# Patient Record
Sex: Female | Born: 1970 | Hispanic: No | Smoking: Never smoker
Health system: Southern US, Community
[De-identification: ages and names within clinical notes are randomized; demographics above are authoritative.]

---

## 2016-11-17 ENCOUNTER — Emergency Department (HOSPITAL_COMMUNITY): Payer: Medicare Other

## 2016-11-17 ENCOUNTER — Encounter (HOSPITAL_COMMUNITY): Payer: Self-pay | Admitting: *Deleted

## 2016-11-17 ENCOUNTER — Emergency Department (HOSPITAL_COMMUNITY)
Admission: EM | Admit: 2016-11-17 | Discharge: 2016-11-17 | Disposition: A | Discharge status: Home / Self Care | Payer: Medicare Other | Attending: Emergency Medicine | Admitting: Emergency Medicine

## 2016-11-17 DIAGNOSIS — Y999 Unspecified external cause status: Secondary | ICD-10-CM | POA: Diagnosis not present

## 2016-11-17 DIAGNOSIS — Y939 Activity, unspecified: Secondary | ICD-10-CM | POA: Insufficient documentation

## 2016-11-17 DIAGNOSIS — Y92410 Unspecified street and highway as the place of occurrence of the external cause: Secondary | ICD-10-CM | POA: Diagnosis not present

## 2016-11-17 DIAGNOSIS — R51 Headache: Secondary | ICD-10-CM | POA: Insufficient documentation

## 2016-11-17 DIAGNOSIS — R0789 Other chest pain: Secondary | ICD-10-CM | POA: Insufficient documentation

## 2016-11-17 DIAGNOSIS — R079 Chest pain, unspecified: Secondary | ICD-10-CM | POA: Diagnosis present

## 2016-11-17 LAB — I-STAT TROPONIN, ED: TROPONIN I, POC: 0 ng/mL (ref 0.00–0.08)

## 2016-11-17 LAB — CBG MONITORING, ED: GLUCOSE-CAPILLARY: 88 mg/dL (ref 65–99)

## 2016-11-17 MED ORDER — TETRACAINE HCL 0.5 % OP SOLN
1.0000 [drp] | Freq: Once | OPHTHALMIC | Status: AC
Start: 2016-11-17 — End: 2016-11-17
  Administered 2016-11-17: 1 [drp] via OPHTHALMIC
  Filled 2016-11-17: qty 2

## 2016-11-17 MED ORDER — SODIUM CHLORIDE 0.9 % IV BOLUS (SEPSIS)
500.0000 mL | Freq: Once | INTRAVENOUS | Status: AC
  Administered 2016-11-17: 500 mL via INTRAVENOUS

## 2016-11-17 MED ORDER — FENTANYL CITRATE (PF) 100 MCG/2ML IJ SOLN
25.0000 ug | Freq: Once | INTRAMUSCULAR | Status: AC
  Administered 2016-11-17: 25 ug via INTRAVENOUS
  Filled 2016-11-17: qty 2

## 2016-11-17 MED ORDER — LORAZEPAM BOLUS VIA INFUSION: 1.0000 mg | Freq: Once | INTRAVENOUS | Status: DC

## 2016-11-17 MED ORDER — FENTANYL CITRATE (PF) 100 MCG/2ML IJ SOLN
12.5000 ug | Freq: Once | INTRAMUSCULAR | Status: AC
  Administered 2016-11-17: 12.5 ug via INTRAVENOUS
  Filled 2016-11-17: qty 2

## 2016-11-17 MED ORDER — LORAZEPAM 2 MG/ML IJ SOLN
1.0000 mg | Freq: Once | INTRAMUSCULAR | Status: AC
  Administered 2016-11-17: 1 mg via INTRAVENOUS
  Filled 2016-11-17: qty 1

## 2016-11-17 MED ORDER — OXYCODONE-ACETAMINOPHEN 5-325 MG PO TABS
2.0000 | ORAL_TABLET | Freq: Once | ORAL | Status: AC
  Administered 2016-11-17: 2 via ORAL
  Filled 2016-11-17: qty 2

## 2016-11-17 MED ORDER — METHOCARBAMOL 500 MG PO TABS: 500.0000 mg | ORAL_TABLET | Freq: Two times a day (BID) | ORAL | 0 refills | Status: AC

## 2016-11-17 MED ORDER — NAPROXEN 500 MG PO TABS: 500.0000 mg | ORAL_TABLET | Freq: Two times a day (BID) | ORAL | 0 refills | Status: AC

## 2016-11-17 MED ORDER — FLUORESCEIN SODIUM 0.6 MG OP STRP
1.0000 | ORAL_STRIP | Freq: Once | OPHTHALMIC | Status: AC
Start: 2016-11-17 — End: 2016-11-17
  Administered 2016-11-17: 1 via OPHTHALMIC
  Filled 2016-11-17: qty 1
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
## 2024-01-31 ENCOUNTER — Ambulatory Visit

## 2024-06-05 ENCOUNTER — Other Ambulatory Visit: Payer: Self-pay | Admitting: Medical Genetics

## 2024-08-28 ENCOUNTER — Other Ambulatory Visit (HOSPITAL_COMMUNITY): Payer: Self-pay

## 2024-10-07 ENCOUNTER — Other Ambulatory Visit: Payer: Self-pay | Admitting: Medical Genetics

## 2024-10-07 DIAGNOSIS — Z006 Encounter for examination for normal comparison and control in clinical research program: Secondary | ICD-10-CM
